# Patient Record
Sex: Male | Born: 1995 | Race: White | Hispanic: No | Marital: Single | Smoking: Current some day smoker
Health system: Southern US, Community
[De-identification: ages and names within clinical notes are randomized; demographics above are authoritative.]

---

## 2019-09-03 ENCOUNTER — Emergency Department (HOSPITAL_COMMUNITY)
Admission: EM | Admit: 2019-09-03 | Discharge: 2019-09-03 | Disposition: A | Discharge status: Home / Self Care | Attending: Emergency Medicine | Admitting: Emergency Medicine

## 2019-09-03 ENCOUNTER — Encounter (HOSPITAL_COMMUNITY): Payer: Self-pay | Admitting: Emergency Medicine

## 2019-09-03 ENCOUNTER — Other Ambulatory Visit: Payer: Self-pay

## 2019-09-03 ENCOUNTER — Emergency Department (HOSPITAL_COMMUNITY)

## 2019-09-03 DIAGNOSIS — Z202 Contact with and (suspected) exposure to infections with a predominantly sexual mode of transmission: Secondary | ICD-10-CM | POA: Insufficient documentation

## 2019-09-03 DIAGNOSIS — R05 Cough: Secondary | ICD-10-CM | POA: Diagnosis not present

## 2019-09-03 DIAGNOSIS — Z20822 Contact with and (suspected) exposure to covid-19: Secondary | ICD-10-CM | POA: Diagnosis not present

## 2019-09-03 DIAGNOSIS — R059 Cough, unspecified: Secondary | ICD-10-CM

## 2019-09-03 LAB — URINALYSIS, ROUTINE W REFLEX MICROSCOPIC
Bacteria, UA: NONE SEEN
Bilirubin Urine: NEGATIVE
Glucose, UA: NEGATIVE mg/dL
Hgb urine dipstick: NEGATIVE
Ketones, ur: NEGATIVE mg/dL
Nitrite: NEGATIVE
Protein, ur: NEGATIVE mg/dL
Specific Gravity, Urine: 1.019 (ref 1.005–1.030)
WBC, UA: >50 WBC/hpf — ABNORMAL HIGH (ref 0–5)
pH: 7 (ref 5.0–8.0)

## 2019-09-03 LAB — RAPID HIV SCREEN (HIV 1/2 AB+AG)
HIV 1/2 Antibodies: NONREACTIVE
HIV-1 P24 Antigen - HIV24: NONREACTIVE

## 2019-09-03 LAB — POC SARS CORONAVIRUS 2 AG -  ED: SARS Coronavirus 2 Ag: NEGATIVE

## 2019-09-03 MED ORDER — CEFTRIAXONE SODIUM 500 MG IJ SOLR
500.0000 mg | Freq: Once | INTRAMUSCULAR | Status: AC
  Administered 2019-09-03: 500 mg via INTRAMUSCULAR
  Filled 2019-09-03: qty 500

## 2019-09-03 MED ORDER — KETOROLAC TROMETHAMINE 60 MG/2ML IM SOLN: 60.0000 mg | Freq: Once | INTRAMUSCULAR | Status: DC

## 2019-09-03 MED ORDER — DOXYCYCLINE HYCLATE 100 MG PO CAPS
100.0000 mg | ORAL_CAPSULE | Freq: Two times a day (BID) | ORAL | 0 refills | Status: AC
Start: 2019-09-03 — End: 2019-09-10

## 2019-09-03 MED ORDER — LIDOCAINE HCL (PF) 1 % IJ SOLN
INTRAMUSCULAR | Status: AC
  Filled 2019-09-03: qty 5

## 2019-09-03 MED ORDER — KETOROLAC TROMETHAMINE 30 MG/ML IJ SOLN
30.0000 mg | Freq: Once | INTRAMUSCULAR | Status: AC
  Administered 2019-09-03: 30 mg via INTRAVENOUS
  Filled 2019-09-03: qty 1

## 2019-09-03 NOTE — Discharge Instructions (Addendum)
You have been treated presumptively today for gonorrhea and you have been prescribed medication to cover for chlamydia.  Please take your antibiotic, as prescribed.  Take with food.  You have been tested today for gonorrhea and chlamydia. These results will be available in approximately 3 days. You may check your MyChart account for results. Please inform all sexual partners of positive results and that they should be tested and treated as well.  Please wait 2 weeks and be sure that you and your partners are symptom free before returning to sexual activity. Please use protection with every sexual encounter.  Follow Up: Please followup with your primary doctor in 3 days for discussion of your diagnoses and further evaluation after today's visit; if you do not have a primary care doctor use the resource guide provided to find one; Please return to the ER for worsening symptoms, high fevers or persistent vomiting.  I recommend that you take Mucinex or similar over-the-counter antitussive for your cough symptoms.  Discontinue tobacco use.  As for your low back discomfort, I recommend over-the-counter medication such as ibuprofen or Tylenol for symptoms of pain.
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
## 2019-09-03 NOTE — ED Triage Notes (Signed)
Pt in from halfway house, c/o low back pain x 1 wk, penile discharge x 2 wks, and chills. Says he's had a bad cough x few wks. Wants to get Covid and STD tested.

## 2019-09-04 LAB — RPR: RPR Ser Ql: NONREACTIVE

## 2021-05-10 IMAGING — DX DG CHEST 1V PORT
1 series · 1 of 1 positions shown · non-contrast
Comparison: None.

CLINICAL DATA: Low back pain for 1 week, cough

EXAM:
PORTABLE CHEST 1 VIEW

[chest ap]
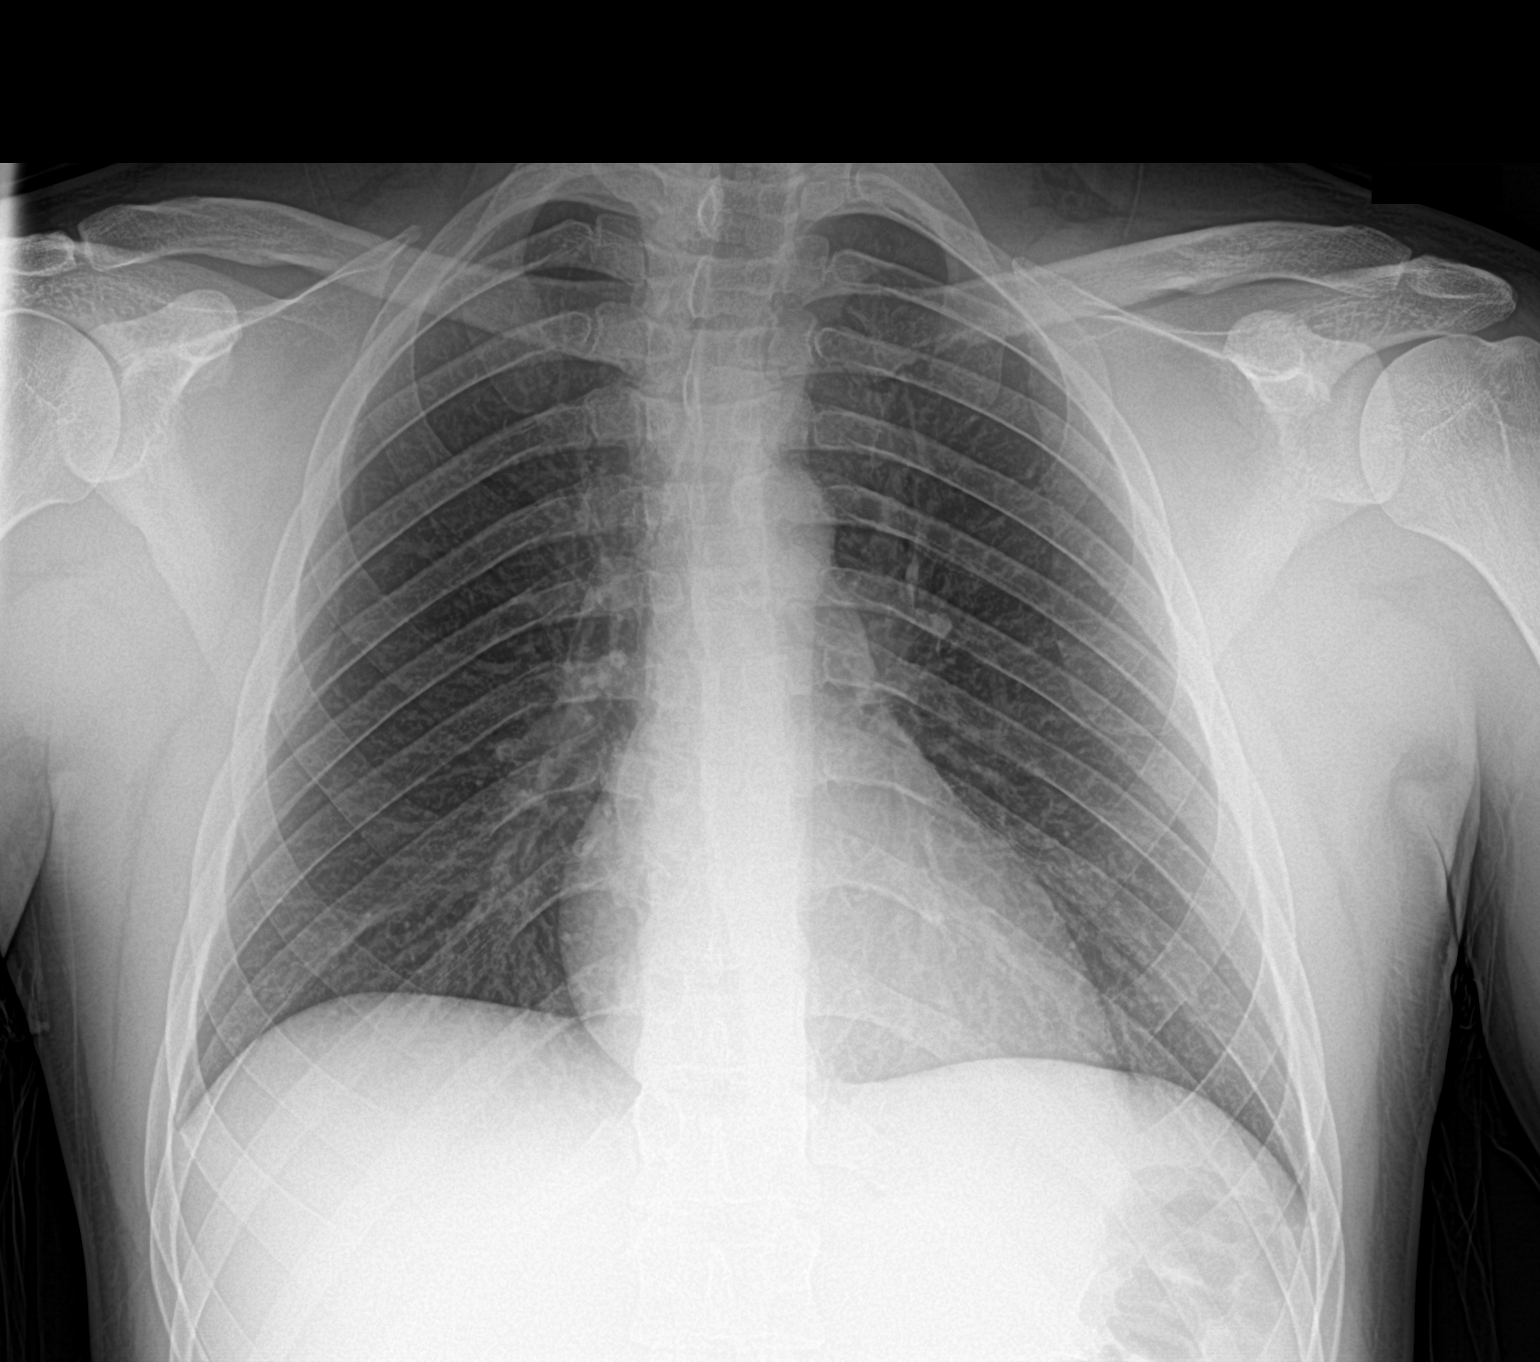

[1 of 1 positions shown; findings below may reference images not displayed]

FINDINGS: The heart size and mediastinal contours are within normal limits.
Both lungs are clear. The visualized skeletal structures are
unremarkable.
IMPRESSION: Unremarkable chest radiograph.
